# Patient Record
Sex: Female | Born: 2000 | Race: White | Hispanic: No | Marital: Single | Smoking: Never smoker
Health system: Southern US, Community
[De-identification: ages and names within clinical notes are randomized; demographics above are authoritative.]

---
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
## 2022-01-09 ENCOUNTER — Ambulatory Visit: Payer: BC Managed Care – PPO | Admitting: Gastroenterology

## 2022-01-15 NOTE — Progress Notes (Deleted)
Primary Care Physician:  Royann Shivers, PA-C  Primary GI: Dr. Marletta Lor  Patient Location: Home   Provider Location: [facility] office   Reason for Visit: abdominal pain and possible constipation f/u   Persons present on the virtual encounter, with roles: patient - Angel Weaver, Brooke Bonito, NP   Total time (minutes) spent on medical discussion: *** minutes  Virtual Visit Encounter Note Visit is conducted virtually and was requested by patient.   I connected with Angel Weaver on 01/15/22 at  3:30 PM EDT by video*** and verified that I am speaking with the correct person using two identifiers.   I discussed the limitations, risks, security and privacy concerns of performing an evaluation and management service by video*** and the availability of in person appointments. I also discussed with the patient that there may be a patient responsible charge related to this service. The patient expressed understanding and agreed to proceed.  No chief complaint on file.    History of Present Illness: Angel Weaver is a 21 y.o. female with no significant medical history who is doing virtual visit to follow up on abdominal pain and possible constipation given patient is now away at college.   Seen for initial consultation 12/20/21 with reports of generalized abdominal pain that is at times increased in her mid upper abdomen. Recent change in bowel habits to a bowel movement every 2-3 days from daily post prandial bowel movements. No specific triggers identified. Previous HIDA scan and ultrasound all normal. She reported constant tolerable pain but at times would only help if she would lie down, bending over made it worse.  Advised miralax daily, famotidine 10-20 mg daily and given dicyclomine to trial twice daily as needed. Discussed further workup if ongoing symptoms including blood work with celiac serologies and thyroid levels along with CT imaging.   Today:      Medications No outpatient  medications have been marked as taking for the 01/17/22 encounter (Appointment) with Aida Raider, NP.     History No past medical history on file.  No past surgical history on file.  No family history on file.  Social History   Socioeconomic History   Marital status: Single    Spouse name: Not on file   Number of children: Not on file   Years of education: Not on file   Highest education level: Not on file  Occupational History   Not on file  Tobacco Use   Smoking status: Never   Smokeless tobacco: Never  Substance and Sexual Activity   Alcohol use: Not Currently   Drug use: Not Currently   Sexual activity: Not Currently    Birth control/protection: None  Other Topics Concern   Not on file  Social History Narrative   Not on file   Social Determinants of Health   Financial Resource Strain: Not on file  Food Insecurity: Not on file  Transportation Needs: Not on file  Physical Activity: Not on file  Stress: Not on file  Social Connections: Not on file      Review of Systems: Gen: Denies fever, chills, anorexia. Denies fatigue, weakness, weight loss.  CV: Denies chest pain, palpitations, syncope, peripheral edema, and claudication. Resp: Denies dyspnea at rest, cough, wheezing, coughing up blood, and pleurisy. GI: see HPI Derm: Denies rash, itching, dry skin Psych: Denies depression, anxiety, memory loss, confusion. No homicidal or suicidal ideation.  Heme: Denies bruising, bleeding, and enlarged lymph nodes.  Observations/Objective: No distress. Alert and oriented. Pleasant.  Well nourished. Normal mood and affect. Unable to perform complete physical exam due to video*** encounter. No video available.    Assessment:  Abdominal pain:  Constipation:    Plan:      Follow Up Instructions:  I discussed the assessment and treatment plan with the patient. The patient was provided an opportunity to ask questions and all were answered. The patient agreed  with the plan and demonstrated an understanding of the instructions.   The patient was advised to call back or seek an in-person evaluation if the symptoms worsen or if the condition fails to improve as anticipated.    Brooke Bonito, MSN, FNP-BC, AGACNP-BC [facility]

## 2022-01-17 ENCOUNTER — Telehealth: Payer: BC Managed Care – PPO | Admitting: Gastroenterology

## 2022-01-29 ENCOUNTER — Telehealth (INDEPENDENT_AMBULATORY_CARE_PROVIDER_SITE_OTHER): Payer: BC Managed Care – PPO | Admitting: Gastroenterology

## 2022-01-29 ENCOUNTER — Encounter: Payer: Self-pay | Admitting: Gastroenterology

## 2022-01-29 VITALS — Ht 64.0 in | Wt 125.0 lb

## 2022-01-29 DIAGNOSIS — K5900 Constipation, unspecified: Secondary | ICD-10-CM | POA: Diagnosis not present

## 2022-01-29 DIAGNOSIS — R1084 Generalized abdominal pain: Secondary | ICD-10-CM

## 2022-01-29 NOTE — Patient Instructions (Signed)
Glad to hear that your symptoms have improved!  If you begin experiencing less frequent bowel movements/increased constipation, please resume MiraLAX or over-the-counter laxative as needed and increasing fiber in your diet.  Continue to drink plenty of water, at least 3-4 bottles of water daily.  Follow-up as needed.  Good luck with the rest of your school year!  It was a pleasure to see you today. I want to create trusting relationships with patients. If you receive a survey regarding your visit,  I greatly appreciate you taking time to fill this out on paper or through your MyChart. I value your feedback.  Brooke Bonito, MSN, FNP-BC, AGACNP-BC [facility]

## 2022-01-29 NOTE — Progress Notes (Signed)
Primary Care Physician:  Royann Shivers, PA-C  Primary GI: Dr. Marletta Lor  Patient Location: Home   Provider Location: [facility] office   Reason for Visit: follow up   Persons present on the virtual encounter, with roles: patient - Angel Weaver and Brooke Bonito, NP   Total time (minutes) spent on medical discussion: 5 minutes  Virtual Visit Encounter Note Visit is conducted virtually and was requested by patient.   I connected with Angel Weaver on 01/29/22 at  3:30 PM EDT by video and verified that I am speaking with the correct person using two identifiers.   I discussed the limitations, risks, security and privacy concerns of performing an evaluation and management service by video and the availability of in person appointments. I also discussed with the patient that there may be a patient responsible charge related to this service. The patient expressed understanding and agreed to proceed.  Chief Complaint  Patient presents with   Follow-up    History of Present Illness: Angel Weaver is a 21 y.o. female with no medical history presenting for follow up of abdominal pain secondary to presumed constipation.   Last seen in the office 12/20/21: She recently had HIDA scan for upper quadrant pain revealing patent cystic and common bile ducts as well as normal gallbladder EF.  Most recent lab work was also unremarkable.  Recent abdominal ultrasound 11/05/2021 with normal gallbladder, pancreas, spleen, kidneys.  She reported bowel movement every couple of days stated she used to go every time she ate with big meals.  Reported her abdominal pain was better with laying down and sitting up made it worse.  Denied difficulty straining or needing to sit for long periods of time to have bowel movement.  Denied any abdominal cramping with going to the bathroom.  Reports pain sometimes subsides a little bit but is usually constantly there, mostly a 7 out of 10.  Reports pain does not wake her up.  Had  vomiting once.  Denied any abdominal bloating or increase belching or flatus.  She did report some upper abdominal fullness.  Her mother reported she always has eaten " like a bird".  Suspected constipation as cause for abdominal pain.  Advised fiber supplementation as well as MiraLAX daily and the addition of famotidine daily.  Dicyclomine was also given to use as needed for pain.  Advised will possibly need further work-up with CT imaging, celiac serologies, thyroid serology if pain persisted despite treatment of constipation.   Today: Has been doing good. Not having any more abdominal pain. Stopped the day she moved in to college. Did try the miralax for 2 days and did not cause her to have diarrhea but was not super helpful either. Bowel movements have been better but not regular. Going every other day. Drinking plenty of water. No straining.  Stool still soft. No more nausea or vomiting either. Eating about the same without issues.     Medications No outpatient medications have been marked as taking for the 01/29/22 encounter (Video Visit) with Aida Raider, NP.     History No past medical history on file.  No past surgical history on file.  No family history on file.  Social History   Socioeconomic History   Marital status: Single    Spouse name: Not on file   Number of children: Not on file   Years of education: Not on file   Highest education level: Not on file  Occupational History   Not on  file  Tobacco Use   Smoking status: Never   Smokeless tobacco: Never  Substance and Sexual Activity   Alcohol use: Not Currently   Drug use: Not Currently   Sexual activity: Not Currently    Birth control/protection: None  Other Topics Concern   Not on file  Social History Narrative   Not on file   Social Determinants of Health   Financial Resource Strain: Not on file  Food Insecurity: Not on file  Transportation Needs: Not on file  Physical Activity: Not on file   Stress: Not on file  Social Connections: Not on file      Review of Systems: Gen: Denies fever, chills, anorexia. Denies fatigue, weakness, weight loss.  CV: Denies chest pain, palpitations, syncope, peripheral edema, and claudication. Resp: Denies dyspnea at rest, cough, wheezing, coughing up blood, and pleurisy. GI: see HPI Derm: Denies rash, itching, dry skin Psych: Denies depression, anxiety, memory loss, confusion. No homicidal or suicidal ideation.  Heme: Denies bruising, bleeding, and enlarged lymph nodes.  Observations/Objective: No distress. Alert and oriented. Pleasant. Well nourished. Normal mood and affect. Unable to perform complete physical exam due to video encounter. No video available.    Assessment:  Abdominal pain/Constipation: After last visit, she began taking MiraLAX only took 2 doses.  Did not have any diarrhea or any significant change in her bowel habits.  She reports her abdominal pain stopped of the day she moved into college and has not had any more since then.  Denies any bloating, gas, melena, or hematochezia.  She reports she has been having about a bowel movement every other day that is soft and does not need to strain.  Suspect she likely had some possible anxiety/psychosomatic pain given recent life changes.  Advised her to keep MiraLAX or simulate Dulcolax on hand if she begins to experience any worsening constipation.  She will reach out to the office if she begins having any more pain to explore further work-up.  Plan:  Follow-up as needed MiraLAX or Dulcolax as needed    Follow Up Instructions:  I discussed the assessment and treatment plan with the patient. The patient was provided an opportunity to ask questions and all were answered. The patient agreed with the plan and demonstrated an understanding of the instructions.   The patient was advised to call back or seek an in-person evaluation if the symptoms worsen or if the condition fails to  improve as anticipated.    Brooke Bonito, MSN, FNP-BC, AGACNP-BC [facility]
